# Patient Record
Sex: Male | Born: 1985 | Race: White | Hispanic: No | Marital: Single | Smoking: Never smoker
Health system: Southern US, Community
[De-identification: ages and names within clinical notes are randomized; demographics above are authoritative.]

## PROBLEM LIST (undated history)

## (undated) DIAGNOSIS — Z973 Presence of spectacles and contact lenses: Secondary | ICD-10-CM

## (undated) DIAGNOSIS — M069 Rheumatoid arthritis, unspecified: Secondary | ICD-10-CM

## (undated) DIAGNOSIS — F419 Anxiety disorder, unspecified: Secondary | ICD-10-CM

## (undated) HISTORY — PX: RHINOPLASTY: SUR1284

## (undated) HISTORY — DX: Presence of spectacles and contact lenses: Z97.3

---

## 2009-12-30 ENCOUNTER — Emergency Department (HOSPITAL_COMMUNITY): Admission: EM | Admit: 2009-12-30 | Discharge: 2009-12-30 | Payer: Self-pay | Admitting: Emergency Medicine

## 2011-07-16 ENCOUNTER — Ambulatory Visit (HOSPITAL_COMMUNITY): Admission: RE | Admit: 2011-07-16 | Payer: No Typology Code available for payment source | Source: Ambulatory Visit

## 2011-07-16 ENCOUNTER — Encounter (HOSPITAL_COMMUNITY): Payer: Self-pay | Admitting: *Deleted

## 2011-07-16 ENCOUNTER — Emergency Department (HOSPITAL_COMMUNITY)
Admission: EM | Admit: 2011-07-16 | Discharge: 2011-07-16 | Disposition: A | Discharge status: Home / Self Care | Payer: Self-pay | Attending: Emergency Medicine | Admitting: Emergency Medicine

## 2011-07-16 DIAGNOSIS — M79609 Pain in unspecified limb: Secondary | ICD-10-CM | POA: Insufficient documentation

## 2011-07-16 DIAGNOSIS — IMO0002 Reserved for concepts with insufficient information to code with codable children: Secondary | ICD-10-CM | POA: Insufficient documentation

## 2011-07-16 DIAGNOSIS — F29 Unspecified psychosis not due to a substance or known physiological condition: Secondary | ICD-10-CM | POA: Insufficient documentation

## 2011-07-16 NOTE — ED Notes (Signed)
EDP at bedside  
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
## 2011-07-16 NOTE — ED Notes (Signed)
Pt states that he does not want x-rays. MD notified
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
## 2012-08-09 ENCOUNTER — Ambulatory Visit: Payer: Self-pay | Admitting: Medical

## 2012-08-10 ENCOUNTER — Encounter: Payer: Self-pay | Admitting: Medical

## 2012-08-10 ENCOUNTER — Ambulatory Visit (INDEPENDENT_AMBULATORY_CARE_PROVIDER_SITE_OTHER): Payer: No Typology Code available for payment source | Admitting: Medical

## 2012-08-10 VITALS — BP 110/78 | HR 88 | Temp 98.3°F | Resp 16 | Wt 167.0 lb

## 2012-08-10 DIAGNOSIS — M25551 Pain in right hip: Secondary | ICD-10-CM

## 2012-08-10 DIAGNOSIS — M25519 Pain in unspecified shoulder: Secondary | ICD-10-CM

## 2012-08-10 DIAGNOSIS — M25569 Pain in unspecified knee: Secondary | ICD-10-CM

## 2012-08-10 DIAGNOSIS — M25559 Pain in unspecified hip: Secondary | ICD-10-CM

## 2012-08-10 DIAGNOSIS — M25512 Pain in left shoulder: Secondary | ICD-10-CM

## 2012-08-10 DIAGNOSIS — M25562 Pain in left knee: Secondary | ICD-10-CM

## 2012-08-10 MED ORDER — DICLOFENAC SODIUM 75 MG PO TBEC: 75.0000 mg | DELAYED_RELEASE_TABLET | Freq: Two times a day (BID) | ORAL | Status: DC

## 2012-08-10 MED ORDER — DICLOFENAC SODIUM 75 MG PO TBEC: 75.0000 mg | DELAYED_RELEASE_TABLET | Freq: Two times a day (BID) | ORAL | Status: AC

## 2012-08-10 NOTE — Progress Notes (Signed)
Subjective: New patient today.    Here for c/o left knee.   Has hx/o torn meniscus for 2.5 years.   Saw ED in the past for knee pain, they advised torn meniscus.  Has had pains and problems with the left knee since.  He has continued to do jujitsu and other activity, but   Recently 47mo was stretching his leg, felt an immediate pop, and this felt.  Gets crunching and cracking in both knees.  Has ongoing "constant knee pain", the knee feels unstable.  If he stands too long on the knee, gets bad aching, and every step feels like hammer tapping on the knee cap.  Hasn't been able to work the past 5 days due to the knee pain and has been getting right hip and right knee soreness as well.   Denies current joint swelling.  Denies any recent fall or trauma.    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  Lately unable to do his job due to the pain.    He was in [employer] for year in the past, was advised then that he had arthritis in the left knee.     Past Medical History  Diagnosis Date  . Wears contact lenses    Family History  Problem Relation Age of Onset  . Rheum arthritis Mother   . Arthritis Mother   . Arthritis Father     ROS as in subjective  Objective: Gen: wd, wn, nad Skin: no erythema or ecchymosis MSK: left knee seems to have laxity with anterior drawer compared to right, there is some pain with McMurray's test, somewhat of a click, pain with rotational motion of the left knee.  Right hip ROM normal, but external rotation and hip extension produces a click.  otherwise LE without deformity other laxity and nontender otherwise.  No swelling.  Left shoulder with mild pain with empty can test, abduction over 100 degrees, otherwise nontender, normal ROM Neuro: normal LE sensation, strength and DTRs Pulses: normal Ext: no clubbing, no cyanosis, no edema   Assessment: Encounter Diagnoses  Name Primary?  . Knee pain, left Yes  .  Hip joint pain, right   . Left shoulder pain    Plan: Knee pain - exam today suggests possible ACL tear vs meniscus issue.  Asymmetry compared to right, and long 2.5 year history of no improvement in the knee pain.  diclofenac prescribed, note for work given, advised rest and ice for now, referral to ortho  Hip pain, right - obvious click with rotational motion, pain, etiology unclear.  Refer to ortho  Left shoulder pain - likely some mild tendonitis.  NSAIDs and rest advised

## 2012-08-12 ENCOUNTER — Other Ambulatory Visit: Payer: Self-pay | Admitting: Orthopedic Surgery

## 2012-08-12 DIAGNOSIS — M25562 Pain in left knee: Secondary | ICD-10-CM

## 2012-08-18 ENCOUNTER — Ambulatory Visit
Admission: RE | Admit: 2012-08-18 | Discharge: 2012-08-18 | Disposition: A | Discharge status: Home / Self Care | Payer: No Typology Code available for payment source | Source: Ambulatory Visit | Attending: Orthopedic Surgery | Admitting: Orthopedic Surgery

## 2012-08-18 DIAGNOSIS — M25562 Pain in left knee: Secondary | ICD-10-CM

## 2012-08-25 ENCOUNTER — Ambulatory Visit: Payer: No Typology Code available for payment source | Admitting: Physical Therapy

## 2012-08-29 ENCOUNTER — Ambulatory Visit: Payer: No Typology Code available for payment source | Attending: Orthopedic Surgery | Admitting: Physical Therapy

## 2015-05-01 ENCOUNTER — Encounter (HOSPITAL_COMMUNITY): Payer: Self-pay

## 2015-05-01 ENCOUNTER — Emergency Department (HOSPITAL_COMMUNITY): Payer: No Typology Code available for payment source

## 2015-05-01 ENCOUNTER — Emergency Department (HOSPITAL_COMMUNITY)
Admission: EM | Admit: 2015-05-01 | Discharge: 2015-05-01 | Disposition: A | Discharge status: Home / Self Care | Payer: Self-pay | Attending: Emergency Medicine | Admitting: Emergency Medicine

## 2015-05-01 DIAGNOSIS — Z973 Presence of spectacles and contact lenses: Secondary | ICD-10-CM | POA: Insufficient documentation

## 2015-05-01 DIAGNOSIS — Z791 Long term (current) use of non-steroidal anti-inflammatories (NSAID): Secondary | ICD-10-CM | POA: Insufficient documentation

## 2015-05-01 DIAGNOSIS — F419 Anxiety disorder, unspecified: Secondary | ICD-10-CM | POA: Insufficient documentation

## 2015-05-01 LAB — CBC WITH DIFFERENTIAL/PLATELET
BASOS PCT: 0 %
Basophils Absolute: 0 10*3/uL (ref 0.0–0.1)
Eosinophils Absolute: 0.1 10*3/uL (ref 0.0–0.7)
Eosinophils Relative: 1 %
HEMATOCRIT: 37.5 % — AB (ref 39.0–52.0)
HEMOGLOBIN: 12.9 g/dL — AB (ref 13.0–17.0)
Lymphocytes Relative: 30 %
Lymphs Abs: 1.5 10*3/uL (ref 0.7–4.0)
MCH: 31.6 pg (ref 26.0–34.0)
MCHC: 34.4 g/dL (ref 30.0–36.0)
MCV: 91.9 fL (ref 78.0–100.0)
MONOS PCT: 6 %
Monocytes Absolute: 0.3 10*3/uL (ref 0.1–1.0)
NEUTROS ABS: 3 10*3/uL (ref 1.7–7.7)
NEUTROS PCT: 63 %
Platelets: 166 10*3/uL (ref 150–400)
RBC: 4.08 MIL/uL — ABNORMAL LOW (ref 4.22–5.81)
RDW: 12.4 % (ref 11.5–15.5)
WBC: 4.9 10*3/uL (ref 4.0–10.5)

## 2015-05-01 LAB — BASIC METABOLIC PANEL
Anion gap: 10 (ref 5–15)
BUN: 13 mg/dL (ref 6–20)
CALCIUM: 9.2 mg/dL (ref 8.9–10.3)
CHLORIDE: 106 mmol/L (ref 101–111)
CO2: 25 mmol/L (ref 22–32)
CREATININE: 0.9 mg/dL (ref 0.61–1.24)
GFR calc non Af Amer: >60 mL/min (ref >60)
GLUCOSE: 101 mg/dL — AB (ref 65–99)
Potassium: 4.1 mmol/L (ref 3.5–5.1)
Sodium: 141 mmol/L (ref 135–145)

## 2015-05-01 LAB — I-STAT TROPONIN, ED: TROPONIN I, POC: 0 ng/mL (ref 0.00–0.08)

## 2015-05-01 MED ORDER — DIAZEPAM 5 MG PO TABS: 5.0000 mg | ORAL_TABLET | Freq: Two times a day (BID) | ORAL | Status: AC | PRN

## 2015-05-01 MED ORDER — DIPHENHYDRAMINE HCL 50 MG/ML IJ SOLN
25.0000 mg | Freq: Once | INTRAMUSCULAR | Status: AC
  Administered 2015-05-01: 25 mg via INTRAVENOUS
  Filled 2015-05-01: qty 1

## 2015-05-01 MED ORDER — DIAZEPAM 5 MG/ML IJ SOLN
5.0000 mg | Freq: Once | INTRAMUSCULAR | Status: AC
  Administered 2015-05-01: 5 mg via INTRAVENOUS
  Filled 2015-05-01: qty 2

## 2015-05-01 NOTE — Discharge Instructions (Signed)
You were seen in the emergency room today for anxiety and shortness of breath/chest pain. Your labs were normal. Your chest symptoms are likely due to your anxiety. We gave you some Valium and Benadryl in the ER which seemed to help your symptoms. As we discussed, I will give you a referral to [facility]. Please call them to establish care and for further management of your anxiety. In the meantime I will give you a small prescription for Valium to take twice a day as needed. Please also follow up with your primary care provider within one week. Return to the ER for new or worsening symptoms.    Generalized Anxiety Disorder Generalized anxiety disorder (GAD) is a mental disorder. It interferes with life functions, including relationships, work, and school. GAD is different from normal anxiety, which everyone experiences at some point in their lives in response to specific life events and activities. Normal anxiety actually helps us prepare for and get through these life events and activities. Normal anxiety goes away after the event or activity is over.  GAD causes anxiety that is not necessarily related to specific events or activities. It also causes excess anxiety in proportion to specific events or activities. The anxiety associated with GAD is also difficult to control. GAD can vary from mild to severe. People with severe GAD can have intense waves of anxiety with physical symptoms (panic attacks).  SYMPTOMS The anxiety and worry associated with GAD are difficult to control. This anxiety and worry are related to many life events and activities and also occur more days than not for 6 months or longer. People with GAD also have three or more of the following symptoms (one or more in children):  Restlessness.   Fatigue.  Difficulty concentrating.   Irritability.  Muscle tension.  Difficulty sleeping or unsatisfying sleep. DIAGNOSIS GAD is diagnosed through an assessment by  your health care provider. Your health care provider will ask you questions aboutyour mood,physical symptoms, and events in your life. Your health care provider may ask you about your medical history and use of alcohol or drugs, including prescription medicines. Your health care provider may also do a physical exam and blood tests. Certain medical conditions and the use of certain substances can cause symptoms similar to those associated with GAD. Your health care provider may refer you to a mental health specialist for further evaluation. TREATMENT The following therapies are usually used to treat GAD:   Medication. Antidepressant medication usually is prescribed for long-term daily control. Antianxiety medicines may be added in severe cases, especially when panic attacks occur.   Talk therapy (psychotherapy). Certain types of talk therapy can be helpful in treating GAD by providing support, education, and guidance. A form of talk therapy called cognitive behavioral therapy can teach you healthy ways to think about and react to daily life events and activities.  Stress managementtechniques. These include yoga, meditation, and exercise and can be very helpful when they are practiced regularly. A mental health specialist can help determine which treatment is best for you. Some people see improvement with one therapy. However, other people require a combination of therapies.   This information is not intended to replace advice given to you by your health care provider. Make sure you discuss any questions you have with your health care provider.   Document Released: 08/22/2012 Document Revised: 05/18/2014 Document Reviewed: 08/22/2012 Elsevier Interactive Patient Education [employer]2016 [employer].
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
## 2015-05-01 NOTE — ED Notes (Signed)
Pt. Coming from home c/o 5 days of severe anxiety. He reports he has not been able to sleep or catch his breath since this has come on. Pt. Reports financial problems lately. Hx of self medicating with marijuana to help anxiety and stopped 1 month ago due to not being able to afford it. Pt. Denies any other medications for anxiety. Pt. Tearful at this time.

## 2015-05-03 ENCOUNTER — Encounter (HOSPITAL_COMMUNITY): Payer: Self-pay | Admitting: *Deleted

## 2015-05-03 ENCOUNTER — Emergency Department (HOSPITAL_COMMUNITY)
Admission: EM | Admit: 2015-05-03 | Discharge: 2015-05-03 | Disposition: A | Discharge status: Home / Self Care | Payer: No Typology Code available for payment source | Attending: Emergency Medicine | Admitting: Emergency Medicine

## 2015-05-03 DIAGNOSIS — Z791 Long term (current) use of non-steroidal anti-inflammatories (NSAID): Secondary | ICD-10-CM | POA: Insufficient documentation

## 2015-05-03 DIAGNOSIS — F419 Anxiety disorder, unspecified: Secondary | ICD-10-CM | POA: Insufficient documentation
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
## 2015-05-03 NOTE — Discharge Instructions (Signed)
°Emergency Department Resource Guide °1) Find a Doctor and Pay Out of Pocket °Although you won't have to find out who is covered by your insurance plan, it is a good idea to ask around and get recommendations. You will then need to call the office and see if the doctor you have chosen will accept you as a new patient and what types of options they offer for patients who are self-pay. Some doctors offer discounts or will set up payment plans for their patients who do not have insurance, but you will need to ask so you aren't surprised when you get to your appointment. ° °2) Contact Your Local Health Department °Not all health departments have doctors that can see patients for sick visits, but many do, so it is worth a call to see if yours does. If you don't know where your local health department is, you can check in your phone book. The CDC also has a tool to help you locate your state's health department, and many state websites also have listings of all of their local health departments. ° °3) Find a Walk-in Clinic °If your illness is not likely to be very severe or complicated, you may want to try a walk in clinic. These are popping up all over the country in pharmacies, drugstores, and shopping centers. They're usually staffed by nurse practitioners or physician assistants that have been trained to treat common illnesses and complaints. They're usually fairly quick and inexpensive. However, if you have serious medical issues or chronic medical problems, these are probably not your best option. ° °No Primary Care Doctor: °- Call [facility] at  832-8000 - they can help you locate a primary care doctor that  accepts your insurance, provides certain services, etc. °- Physician Referral Service- 1-800-533-3463 ° °Chronic Pain Problems: °Organization         Address  Phone   Notes  °[facility]  (336) 297-2271 Patients need to be referred by their primary care doctor.  ° °Medication  Assistance: °Organization         Address  Phone   Notes  °[place] [facility] [address] °[place], [place] [address] (336) 641-8030 --Must be a resident of [place] °-- Must have NO insurance coverage whatsoever (no Medicaid/ Medicare, etc.) °-- The pt. MUST have a primary care doctor that directs their care regularly and follows them in the community °  °[facility]  (866) 331-1348   °[employer]  (888) 892-1162   ° °Agencies that provide inexpensive medical care: °Organization         Address  Phone   Notes  °[facility]  (336) 832-8035   °[facility]    (336) 832-7272   °[facility] [address] °[place], [place] [address] (336) 832-4777   °[facility] [address], °[place] (336) 271-4999   °[facility]    (336) 373-0678   °[facility]    (336) 272-1050   °[facility] ° [address], [place] Phone:  (336) 832-4444, Fax:  (336) 832-4440 Hours of Operation:  9 am - 6 pm, M-F.  Also accepts Medicaid/Medicare and self-pay.  °[facility] ° [address], [place] Phone: (336) 832-3150, Fax: (336) 832-3151. Hours of Operation:  8:30 am - 5:30 pm, M-F.  Also accepts Medicaid and self-pay.  °[facility] [place] [address], [place] Phone: (336) 878-6027   °[facility] [address], [place], [place] (336)723-1848, Ext. 123 Mondays & Thursdays: 7-9 AM.  First 15 patients are seen on a first come, first serve basis. °  ° °Medicaid-accepting [place] Providers: ° °Organization         Address  Phone   Notes  °[facility] [address], [place] (336) 641-2100 Also accepts self-pay patients.  °[facility] [address], [place] ° (336) 856-9996   °[facility] [address], [place]  (336) 288-8857   °[facility] [address] [place] [address], [place] (336) 299-7000   °Veita Bland [address], [place]  ° (336) 373-1557 Only accepts  Access Medicaid patients after they have their name applied to their card.  ° °Self-Pay (no insurance) in [place]: ° °Organization         Address  Phone   Notes  °Sickle Cell Patients, [facility] [address], [place] (336) 832-1970   °[facility] [address], [place] (336) 832-4400   °[facility] [place] ° [address], [place]  (336) 992-4800   °[facility]/Dr. Osei-Bonsu ° [address] [place] [address], [place] or [address], [place] (336) 841-8500 Phone number for both [place] and [place] locations is the same.  °[facility] [address], [place] (336) 299-0000   °[facility] [address] [place] [address], [place] or [address] (336) 852-7530 °(336) 878-2260   °[facility] [address], [place] (336) 350-1642, phone; (336) 294-5005, fax Sees patients 1st and 3rd Saturday of every month.  Must not qualify for public or private insurance (i.e. Medicaid, Medicare, [place] Health Choice, Veterans' Benefits) • Household income should be no more than 200% of the poverty level •The clinic cannot treat you if you are pregnant or think you are pregnant • Sexually transmitted diseases are not treated at the clinic.  ° ° °Dental Care: °Organization         Address  Phone  Notes  °[place] [facility] [address],  (336) 641-6152 Accepts children up to age 21 who are enrolled in Medicaid or [place] Health Choice; pregnant women with a Medicaid card; and children who have applied for Medicaid or [place] Health Choice, but were declined, whose parents can pay a reduced fee at time of service.  °[place]  [facility] [place]  [address], [place] (336) 641-7733 Accepts children up to age 21 who are enrolled in Medicaid or [place] Health Choice; pregnant women with a Medicaid card; and children who have applied for Medicaid or [place] Health Choice, but were declined, whose parents can pay a reduced fee at time of service.  °[facility] ° [address],  (336) 641-4533 Patients are seen by appointment only. Walk-ins are not accepted. [facility] will see patients 18 years of age and older. °Monday - Tuesday (8am-5pm) °Most Wednesdays (8:30-5pm) °$30 per visit, cash only  °[facility] ° [address], [place] (336) 641-4533 Patients are seen by appointment only. Walk-ins are not accepted. [facility] will see patients 18 years of age and older. °One   Wednesday Evening (Monthly: Volunteer Based).  $30 per visit, cash only  °[facility]  (919) 537-3737 for adults; Children under age 4, call Graduate Pediatric Dentistry at (919) [phone]. Children aged 4-14, please call (919) 537-3737 to request a pediatric application. ° Dental services are provided in all areas of dental care including fillings, crowns and bridges, complete and partial dentures, implants, gum treatment, root canals, and extractions. Preventive care is also provided. Treatment is provided to both adults and children. °Patients are selected via a lottery and there is often a waiting list. °  °[facility] [address], °[place] ° (336) 763-8833 [email or web address] °  °[facility] [address], [place], [place] (336)723-1848, Ext. 123 Second and Fourth Thursday of each month, opens at 6:30 AM; Clinic ends at 9 AM.  Patients are seen on a first-come first-served basis, and a limited number are seen during each clinic.  ° °[facility] ° [address], [place], [place] (336) 723-7904    Eligibility Requirements °You must have lived in [place], [place], or [place] counties for at least the last three months. °  You cannot be eligible for state or federal sponsored healthcare insurance, including Veterans Administration, Medicaid, or Medicare. °  You generally cannot be eligible for healthcare insurance through your employer.  °  How to apply: °Eligibility screenings are held every Tuesday and Wednesday afternoon from 1:00 pm until 4:00 pm. You do not need an appointment for the interview!  °[facility] [address], [place], [place] 336-631-2330   °[facility]  336-342-8273   °[facility]  336-703-3100   °[facility]  336-570-6415   ° °Behavioral Health Resources in the Community: °Intensive Outpatient Programs °Organization         Address  Phone  Notes  °[facility] [address], [place], [place] 336-878-6098   °[facility] [address], [place], [place] 336-832-9800   °[facility] [address], [place], [place] ° 336-882-2125   °[facility] [address],  °[place], [place] 1-800-853-5163 or 336-641-4981   °Substance Abuse Resources °Organization         Address  Phone  Notes  °[facility]  336-882-2125   °[facility]  336-784-9470   °[facility]  336-285-9073   °[facility]  336-845-3988   °Residential & Outpatient Substance Abuse Program  1-800-659-3381   °Psychological Services °Organization         Address  Phone  Notes  °[facility]  336- 832-9600   °[facility]  336- 378-7881   °[facility] [address], [place] 1-800-853-5163 or 336-641-4981   ° °Mobile Crisis Teams °Organization         Address  Phone  Notes  °[facility], Mobile Crisis Care Unit  1-877-626-1772   °Assertive °[facility] ° [address]  [place], [place] 336-834-9664   °Sharon DeEsch [address] ° [place] 336-554-5454   ° °Self-Help/Support Groups °Organization         Address  Phone             Notes  °[facility]  - variety of support groups  336- 373-1402 Call for more information  °Narcotics Anonymous (NA), [facility] [address], °[place]   2 meetings at this location  ° °  Residential Treatment Programs °Organization         Address  Phone  Notes  °[facility] [address],    °[place]  1-866-801-8205   °[facility] ° [address], [place], [place] 704-293-8524   °[facility] [address], [place] 336-845-3988 Admissions: 8am-3pm M-F  °[facility] [address]    °[place], [place] 336-841-1104   °[facility] [address], [place], [place] 336-379-7146   °[facility] [address]  °[place], [place] 336-285-9073   °[facility] Intensive Outpatient [address], [place], [place] 336-852-3033   °[facility] [address]  °[place], [place] 1-877-615-2722 or 336-784-9470   °[facility] [address] [place], [place] 336-227-7417 Accepts Medicaid  °[facility] [address]  °[place] 1-800-659-3381 Substance Abuse/Addiction Treatment  ° °[place] Behavioral Health Resources °Organization         Address  Phone  Notes  °[facility]  (888) 581-9988   °Julie Brannon, PhD [address] [place], [place]   (336) 349-5553 or (336) 951-0000   °[facility]   [address] °[place], [place] (336) 349-4454   °[facility] [address], [place], [place] (336) 342-8316 Insurance/Medicaid/sponsorship through [facility]  °[facility] [address]                                    [place], [place] (336) 342-8316 Therapy/tele-psych/case    °[facility] [address]  ° [place], [place] (336) 349-2233    °Dr. Arfeen  (336) 349-4544   °[facility] [place]  [employer] [place] [facility] 1) [address], [place] °2) [address], [place] °3)  [address], [place] (336) 349-3220 °(336) 342-7768 ° °(336) 342-8140   °[place] [facility] (336) 342-1394 or (336) 342-3537 (After Hours)    ° ° °
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
## 2015-05-03 NOTE — ED Notes (Addendum)
Pt presents via POV c/o anxiety.  Pt reports being seen 2 days ago, prescribed diazepam, reports no relief.  Pt a x 4, denues SI/HI at this time. Reports trouble sleeping.    Reports triggers: lost his job before thanksgiving, got kicked out of his gma house, no relationship with family now.  Family hx of anxiety and depression.  Tearful when speaking with RN.

## 2019-02-25 ENCOUNTER — Encounter (HOSPITAL_COMMUNITY): Payer: Self-pay

## 2019-02-25 ENCOUNTER — Emergency Department (HOSPITAL_COMMUNITY)
Admission: EM | Admit: 2019-02-25 | Discharge: 2019-02-25 | Disposition: A | Discharge status: Home / Self Care | Payer: 59 | Attending: Emergency Medicine | Admitting: Emergency Medicine

## 2019-02-25 ENCOUNTER — Other Ambulatory Visit: Payer: Self-pay

## 2019-02-25 DIAGNOSIS — F419 Anxiety disorder, unspecified: Secondary | ICD-10-CM

## 2019-02-25 DIAGNOSIS — Z79899 Other long term (current) drug therapy: Secondary | ICD-10-CM | POA: Diagnosis not present

## 2019-02-25 HISTORY — DX: Rheumatoid arthritis, unspecified: M06.9

## 2019-02-25 HISTORY — DX: Anxiety disorder, unspecified: F41.9
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
## 2019-02-25 NOTE — ED Triage Notes (Signed)
Patient is here with reports of anxiety  He says he was at [facility] and was told to come to the ED for xanax.

## 2019-02-25 NOTE — Discharge Instructions (Signed)
Continue your home medications as previously prescribed. Is important for you to follow-up with your psychiatrist. Return to the ED if you start to develop chest pain, shortness of breath, leg swelling, coughing or vomiting up blood.

## 2020-05-29 ENCOUNTER — Ambulatory Visit (HOSPITAL_COMMUNITY)
Admission: EM | Admit: 2020-05-29 | Discharge: 2020-05-29 | Disposition: A | Discharge status: Home / Self Care | Payer: Worker's Compensation

## 2020-05-29 ENCOUNTER — Other Ambulatory Visit: Payer: Self-pay

## 2020-05-29 ENCOUNTER — Encounter (HOSPITAL_COMMUNITY): Payer: Self-pay | Admitting: Emergency Medicine

## 2020-05-29 DIAGNOSIS — R6884 Jaw pain: Secondary | ICD-10-CM

## 2020-05-29 DIAGNOSIS — M25511 Pain in right shoulder: Secondary | ICD-10-CM
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
## 2020-05-30 ENCOUNTER — Other Ambulatory Visit: Payer: Self-pay | Admitting: Family Medicine

## 2020-05-30 ENCOUNTER — Ambulatory Visit: Payer: Self-pay

## 2020-05-30 DIAGNOSIS — S0083XA Contusion of other part of head, initial encounter: Secondary | ICD-10-CM

## 2020-06-13 ENCOUNTER — Other Ambulatory Visit: Payer: Self-pay

## 2020-06-13 ENCOUNTER — Ambulatory Visit: Payer: Self-pay

## 2020-06-13 ENCOUNTER — Other Ambulatory Visit: Payer: Self-pay | Admitting: Family Medicine

## 2020-06-13 DIAGNOSIS — M25511 Pain in right shoulder: Secondary | ICD-10-CM

## 2022-07-03 ENCOUNTER — Other Ambulatory Visit: Payer: Self-pay

## 2022-07-03 ENCOUNTER — Emergency Department (HOSPITAL_COMMUNITY): Payer: No Typology Code available for payment source

## 2022-07-03 ENCOUNTER — Emergency Department (HOSPITAL_COMMUNITY)
Admission: EM | Admit: 2022-07-03 | Discharge: 2022-07-04 | Disposition: A | Discharge status: Home / Self Care | Payer: No Typology Code available for payment source | Attending: Emergency Medicine | Admitting: Emergency Medicine

## 2022-07-03 ENCOUNTER — Encounter (HOSPITAL_COMMUNITY): Payer: Self-pay

## 2022-07-03 DIAGNOSIS — K2900 Acute gastritis without bleeding: Secondary | ICD-10-CM

## 2022-07-03 DIAGNOSIS — R7309 Other abnormal glucose: Secondary | ICD-10-CM | POA: Diagnosis not present

## 2022-07-03 DIAGNOSIS — R112 Nausea with vomiting, unspecified: Secondary | ICD-10-CM | POA: Diagnosis present

## 2022-07-03 DIAGNOSIS — F1290 Cannabis use, unspecified, uncomplicated: Secondary | ICD-10-CM | POA: Diagnosis not present

## 2022-07-03 DIAGNOSIS — R1084 Generalized abdominal pain: Secondary | ICD-10-CM

## 2022-07-03 LAB — COMPREHENSIVE METABOLIC PANEL
ALT: 38 U/L (ref 0–44)
AST: 24 U/L (ref 15–41)
Albumin: 4.5 g/dL (ref 3.5–5.0)
Alkaline Phosphatase: 47 U/L (ref 38–126)
Anion gap: 9 (ref 5–15)
BUN: 12 mg/dL (ref 6–20)
CO2: 26 mmol/L (ref 22–32)
Calcium: 9.5 mg/dL (ref 8.9–10.3)
Chloride: 102 mmol/L (ref 98–111)
Creatinine, Ser: 0.93 mg/dL (ref 0.61–1.24)
GFR, Estimated: >60 mL/min (ref >60)
Glucose, Bld: 120 mg/dL — ABNORMAL HIGH (ref 70–99)
Potassium: 4 mmol/L (ref 3.5–5.1)
Sodium: 137 mmol/L (ref 135–145)
Total Bilirubin: 0.9 mg/dL (ref 0.3–1.2)
Total Protein: 8.2 g/dL — ABNORMAL HIGH (ref 6.5–8.1)

## 2022-07-03 LAB — URINALYSIS, ROUTINE W REFLEX MICROSCOPIC
Bacteria, UA: NONE SEEN
Bilirubin Urine: NEGATIVE
Glucose, UA: NEGATIVE mg/dL
Ketones, ur: NEGATIVE mg/dL
Leukocytes,Ua: NEGATIVE
Nitrite: NEGATIVE
Protein, ur: NEGATIVE mg/dL
Specific Gravity, Urine: 1.006 (ref 1.005–1.030)
pH: 5 (ref 5.0–8.0)

## 2022-07-03 LAB — CBC WITH DIFFERENTIAL/PLATELET
Abs Immature Granulocytes: 0.02 10*3/uL (ref 0.00–0.07)
Basophils Absolute: 0 10*3/uL (ref 0.0–0.1)
Basophils Relative: 0 %
Eosinophils Absolute: 0 10*3/uL (ref 0.0–0.5)
Eosinophils Relative: 0 %
HCT: 47.8 % (ref 39.0–52.0)
Hemoglobin: 15.8 g/dL (ref 13.0–17.0)
Immature Granulocytes: 0 %
Lymphocytes Relative: 14 %
Lymphs Abs: 1.5 10*3/uL (ref 0.7–4.0)
MCH: 30.4 pg (ref 26.0–34.0)
MCHC: 33.1 g/dL (ref 30.0–36.0)
MCV: 91.9 fL (ref 80.0–100.0)
Monocytes Absolute: 0.7 10*3/uL (ref 0.1–1.0)
Monocytes Relative: 7 %
Neutro Abs: 8.1 10*3/uL — ABNORMAL HIGH (ref 1.7–7.7)
Neutrophils Relative %: 79 %
Platelets: 263 10*3/uL (ref 150–400)
RBC: 5.2 MIL/uL (ref 4.22–5.81)
RDW: 12.4 % (ref 11.5–15.5)
WBC: 10.3 10*3/uL (ref 4.0–10.5)
nRBC: 0 % (ref 0.0–0.2)

## 2022-07-03 LAB — RAPID URINE DRUG SCREEN, HOSP PERFORMED
Amphetamines: NOT DETECTED
Barbiturates: NOT DETECTED
Benzodiazepines: NOT DETECTED
Cocaine: NOT DETECTED
Opiates: NOT DETECTED
Tetrahydrocannabinol: POSITIVE — AB

## 2022-07-03 LAB — LIPASE, BLOOD: Lipase: 31 U/L (ref 11–51)

## 2022-07-03 LAB — CBG MONITORING, ED: Glucose-Capillary: 123 mg/dL — ABNORMAL HIGH (ref 70–99)

## 2022-07-03 MED ORDER — ONDANSETRON 4 MG PO TBDP
4.0000 mg | ORAL_TABLET | Freq: Once | ORAL | Status: AC
  Administered 2022-07-03: 4 mg via ORAL
  Filled 2022-07-03: qty 1

## 2022-07-03 MED ORDER — IOHEXOL 300 MG/ML  SOLN
100.0000 mL | Freq: Once | INTRAMUSCULAR | Status: AC | PRN
  Administered 2022-07-03: 100 mL via INTRAVENOUS

## 2022-07-03 MED ORDER — DIPHENHYDRAMINE HCL 50 MG/ML IJ SOLN
25.0000 mg | Freq: Once | INTRAMUSCULAR | Status: AC
  Administered 2022-07-04: 25 mg via INTRAVENOUS
  Filled 2022-07-03: qty 1

## 2022-07-03 MED ORDER — PANTOPRAZOLE SODIUM 40 MG IV SOLR
40.0000 mg | Freq: Once | INTRAVENOUS | Status: AC
  Administered 2022-07-03: 40 mg via INTRAVENOUS
  Filled 2022-07-03: qty 10

## 2022-07-03 MED ORDER — METOCLOPRAMIDE HCL 5 MG/ML IJ SOLN
10.0000 mg | Freq: Once | INTRAMUSCULAR | Status: AC
  Administered 2022-07-04: 10 mg via INTRAVENOUS
  Filled 2022-07-03: qty 2

## 2022-07-03 MED ORDER — LACTATED RINGERS IV BOLUS: 1000.0000 mL | Freq: Once | INTRAVENOUS | Status: DC

## 2022-07-03 MED ORDER — LACTATED RINGERS IV BOLUS
1000.0000 mL | Freq: Once | INTRAVENOUS | Status: AC
  Administered 2022-07-03: 1000 mL via INTRAVENOUS

## 2022-07-03 MED ORDER — FAMOTIDINE IN NACL 20-0.9 MG/50ML-% IV SOLN
20.0000 mg | Freq: Once | INTRAVENOUS | Status: AC
  Administered 2022-07-04: 20 mg via INTRAVENOUS
  Filled 2022-07-03: qty 50

## 2022-07-03 NOTE — ED Triage Notes (Signed)
Pt with abdominal pain, nausea/vomiting x several days.  States began shortly after he stopped smoking marijuana after instructed to do so by the pain clinic.  He has been nauseated to the point he cannot take his prescribed pain medication and has had increased back and hip pain.  Pt reports feeling "hot" and "light headed" in the mornings and after hot baths.

## 2022-07-03 NOTE — ED Provider Notes (Signed)
[facility] Provider Note   CSN: [id number] Arrival date & time: 07/03/22  1926     History {Add pertinent medical, surgical, social history, OB history to HPI:1} Chief Complaint  Patient presents with   Abdominal Pain   Emesis    Kenneth Harrison is a 37 y.o. male.  HPI Kenneth Harrison , a 37 y.o. male.  Pt complains of nausea. States that he has been nauseated for 4 days. States today it got significantly worse and "painful." States he had vomiting a few days ago and then again today. States that he stopped smoking marijuana about 2 days prior to these symptoms starting.  Reports he was having some sweats but has not had a documented fever.  Reports he has been very nauseated he has had some episodes of vomiting.  He has been able to eat and drink certain foods this week.  He has not vomited up everything he has tried to eat.  Does report however he chronically takes hydrocodone 10\325 4 chronic back pain.  He reports usually will take it 5 days and then take a break from it.    Home Medications Prior to Admission medications   Medication Sig Start Date End Date Taking? Authorizing Provider  diazepam (VALIUM) 5 MG tablet Take 1 tablet (5 mg total) by mouth every 12 (twelve) hours as needed for anxiety. 05/01/15   Tomi Likens, PA-C  diclofenac (VOLTAREN) 75 MG EC tablet Take 1 tablet (75 mg total) by mouth 2 (two) times daily. Patient not taking: No sig reported 08/10/12   Tysinger, Camelia Eng, PA-C  folic acid (FOLVITE) 1 MG tablet Take 1 tablet by mouth daily. 10/21/18   [provider]  methotrexate (RHEUMATREX) 2.5 MG tablet 2.5 mg. 10/19/18   [provider]  OVER THE COUNTER MEDICATION Take 2 tablets by mouth 2 (two) times daily. 5htp    [provider]  predniSONE (DELTASONE) 5 MG tablet Take 2 tablets by mouth daily. 05/29/20   [provider]      Allergies    Patient has no known allergies.     Review of Systems   Review of Systems  Physical Exam Updated Vital Signs BP (!) 145/91 (BP Location: Left Arm)   Pulse 69   Temp 98.6 F (37 C) (Oral)   Resp 20   Ht '5\' 10"'$  (1.778 m)   Wt 93 kg   SpO2 98%   BMI 29.41 kg/m  Physical Exam Constitutional:      Appearance: He is well-developed.     Comments: Alert nontoxic.  GCS 15.  No acute distress but anxious in appearance.  No respiratory distress.  HENT:     Head: Normocephalic and atraumatic.     Mouth/Throat:     Pharynx: Oropharynx is clear.  Eyes:     Extraocular Movements: Extraocular movements intact.  Cardiovascular:     Rate and Rhythm: Normal rate and regular rhythm.  Pulmonary:     Effort: Pulmonary effort is normal.     Breath sounds: Normal breath sounds.  Abdominal:     Comments: Abdomen diffusely soft centrally.  Moderate discomfort to palpation.  No guarding.  No palpable mass.  Musculoskeletal:        General: No swelling. Normal range of motion.     Cervical back: Neck supple.     Right lower leg: No edema.     Left lower leg: No edema.  Skin:  General: Skin is warm and dry.  Neurological:     General: No focal deficit present.     Mental Status: He is alert and oriented to person, place, and time.     Motor: No weakness.     Coordination: Coordination normal.  Psychiatric:     Comments: Patient is anxious in appearance.     ED Results / Procedures / Treatments   Labs (all labs ordered are listed, but only abnormal results are displayed) Labs Reviewed  COMPREHENSIVE METABOLIC PANEL - Abnormal; Notable for the following components:      Result Value   Glucose, Bld 120 (*)    Total Protein 8.2 (*)    All other components within normal limits  CBC WITH DIFFERENTIAL/PLATELET - Abnormal; Notable for the following components:   Neutro Abs 8.1 (*)    All other components within normal limits  URINALYSIS, ROUTINE W REFLEX MICROSCOPIC - Abnormal; Notable for the following components:    Color, Urine STRAW (*)    Hgb urine dipstick SMALL (*)    All other components within normal limits  CBG MONITORING, ED - Abnormal; Notable for the following components:   Glucose-Capillary 123 (*)    All other components within normal limits  LIPASE, BLOOD  RAPID URINE DRUG SCREEN, HOSP PERFORMED    EKG None  Radiology No results found.  Procedures Procedures  {Document cardiac monitor, telemetry assessment procedure when appropriate:1}  Medications Ordered in ED Medications  ondansetron (ZOFRAN-ODT) disintegrating tablet 4 mg (4 mg Oral Given 07/03/22 2056)  lactated ringers bolus 1,000 mL (1,000 mLs Intravenous New Bag/Given 07/03/22 2147)  pantoprazole (PROTONIX) injection 40 mg (40 mg Intravenous Given 07/03/22 2144)    ED Course/ Medical Decision Making/ A&P   {   Click here for ABCD2, HEART and other calculatorsREFRESH Note before signing :1}                          Medical Decision Making Amount and/or Complexity of Data Reviewed Labs: ordered. Radiology: ordered.  Risk Prescription drug management.   Patient has several days of nausea and central abdominal pain.  Differential diagnosis includes appendicitis\pneumonia\gastritis\pancreatitis\cannabinoid hyperemesis and abdominal pain. Proceed with broad diagnostic evaluation including lab work, CT scan.  Patient treated with fluid resuscitation lactated Ringer's, Zofran and Protonix.  {Document critical care time when appropriate:1} {Document review of labs and clinical decision tools ie heart score, Chads2Vasc2 etc:1}  {Document your independent review of radiology images, and any outside records:1} {Document your discussion with family members, caretakers, and with consultants:1} {Document social determinants of health affecting pt's care:1} {Document your decision making why or why not admission, treatments were needed:1} Final Clinical Impression(s) / ED Diagnoses Final diagnoses:  Nausea and vomiting,  unspecified vomiting type  Generalized abdominal pain    Rx / DC Orders ED Discharge Orders     None

## 2022-07-03 NOTE — ED Provider Triage Note (Signed)
Emergency Medicine Provider Triage Evaluation Note  Kenneth Harrison , a 37 y.o. male  was evaluated in triage.  Pt complains of nausea. States that he has been nauseated for 4 days. States today it got significantly worse and "painful." States he had vomiting a few days ago and then again today. States that he stopped smoking marijuana about 2 days prior to these symptoms starting.   Review of Systems  Positive: See above Negative:   Physical Exam  BP (!) 145/91 (BP Location: Left Arm)   Pulse 69   Temp 98.6 F (37 C) (Oral)   Resp 20   Ht '5\' 10"'$  (1.778 m)   Wt 93 kg   SpO2 98%   BMI 29.41 kg/m  Gen:   Awake, no distress   Resp:  Normal effort  MSK:   Moves extremities without difficulty  Other:    Medical Decision Making  Medically screening exam initiated at 8:07 PM.  Appropriate orders placed.  Kenneth Harrison was informed that the remainder of the evaluation will be completed by another provider, this initial triage assessment does not replace that evaluation, and the importance of remaining in the ED until their evaluation is complete.     Kenneth Hillier, PA-C 07/03/22 2010

## 2022-07-04 MED ORDER — METOCLOPRAMIDE HCL 10 MG PO TABS: 10.0000 mg | ORAL_TABLET | Freq: Three times a day (TID) | ORAL | 0 refills | Status: AC | PRN

## 2022-07-04 MED ORDER — PANTOPRAZOLE SODIUM 20 MG PO TBEC: 20.0000 mg | DELAYED_RELEASE_TABLET | Freq: Every day | ORAL | 0 refills | Status: AC

## 2022-07-04 MED ORDER — FAMOTIDINE 20 MG PO TABS: 20.0000 mg | ORAL_TABLET | Freq: Two times a day (BID) | ORAL | 0 refills | Status: AC

## 2022-07-04 NOTE — ED Notes (Signed)
Pt would like to leave without completing the IV.

## 2022-07-04 NOTE — ED Notes (Signed)
PT requesting to be disconnected from everything because he "has been here for 4 hours and is feeling anxious."

## 2022-07-04 NOTE — Discharge Instructions (Addendum)
1.  Review information for gastritis.  Start taking Protonix daily and Pepcid twice daily as prescribed. 2.  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 3.  Follow-up at the [facility] as soon as possible with your gastroenterologist.  Have them review the CT scan done in the emergency department.  It shows inflammation of your stomach but also shows some enlargement of the pancreas.  No specific mass was identified but this needs to be evaluated to make sure there are no other concerning findings. 4.  Return to the emergency department if you have suddenly worsening pain, cannot tolerate fluids despite taking medications or other concerning changes.

## 2023-01-12 IMAGING — DX DG SHOULDER 2+V*R*
3 series · 3 of 3 positions shown · non-contrast
Comparison: No prior.

CLINICAL DATA: Right shoulder pain.

EXAM:
RIGHT SHOULDER - 2+ VIEW

[shoulder ap]
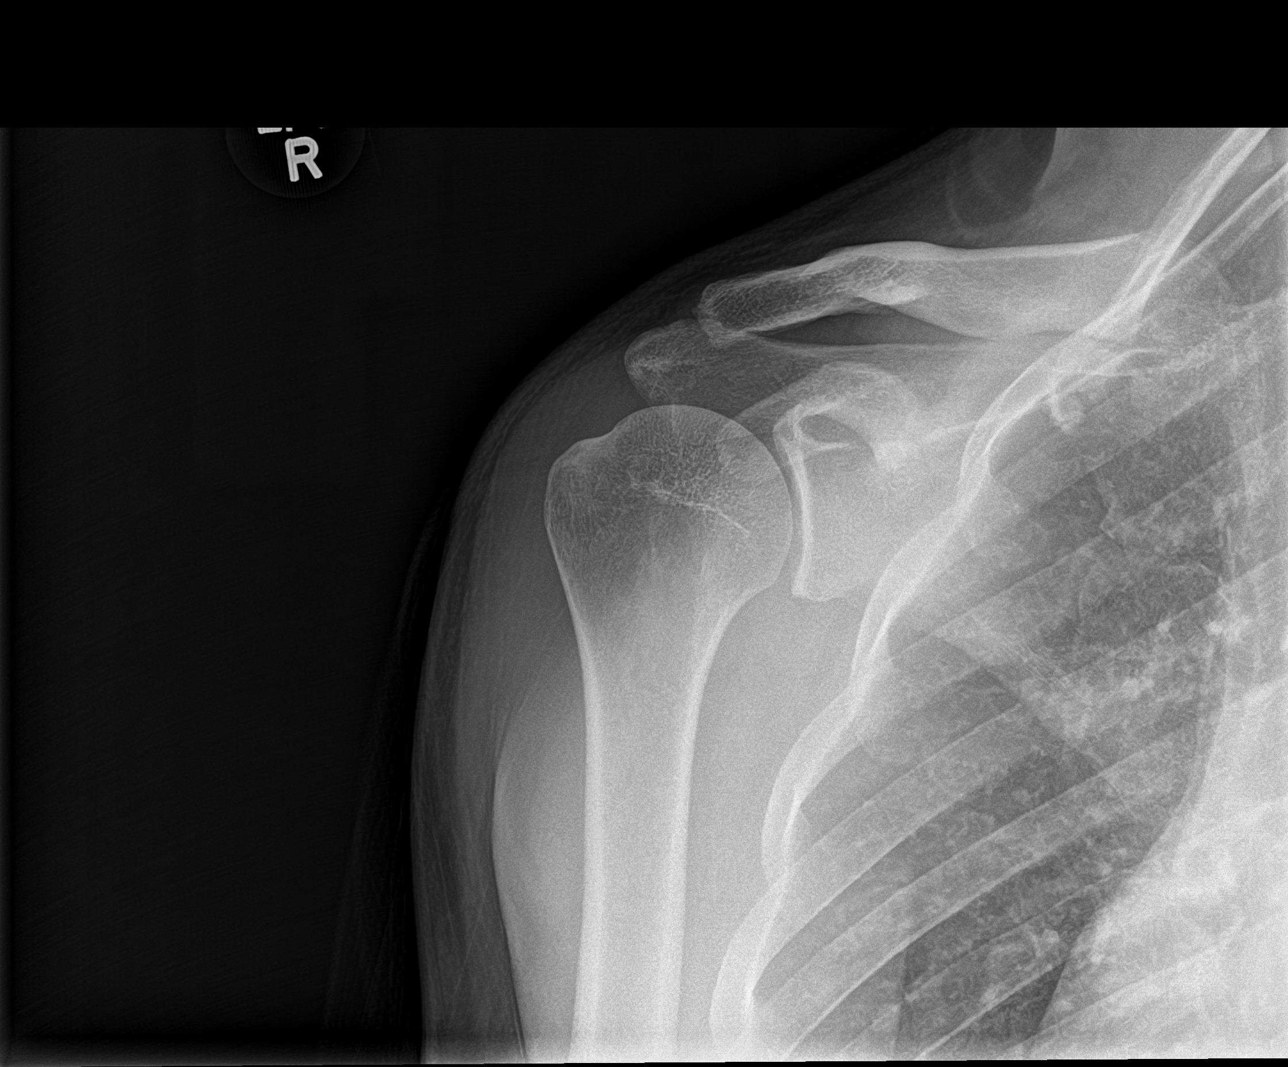

[shoulder y-view]
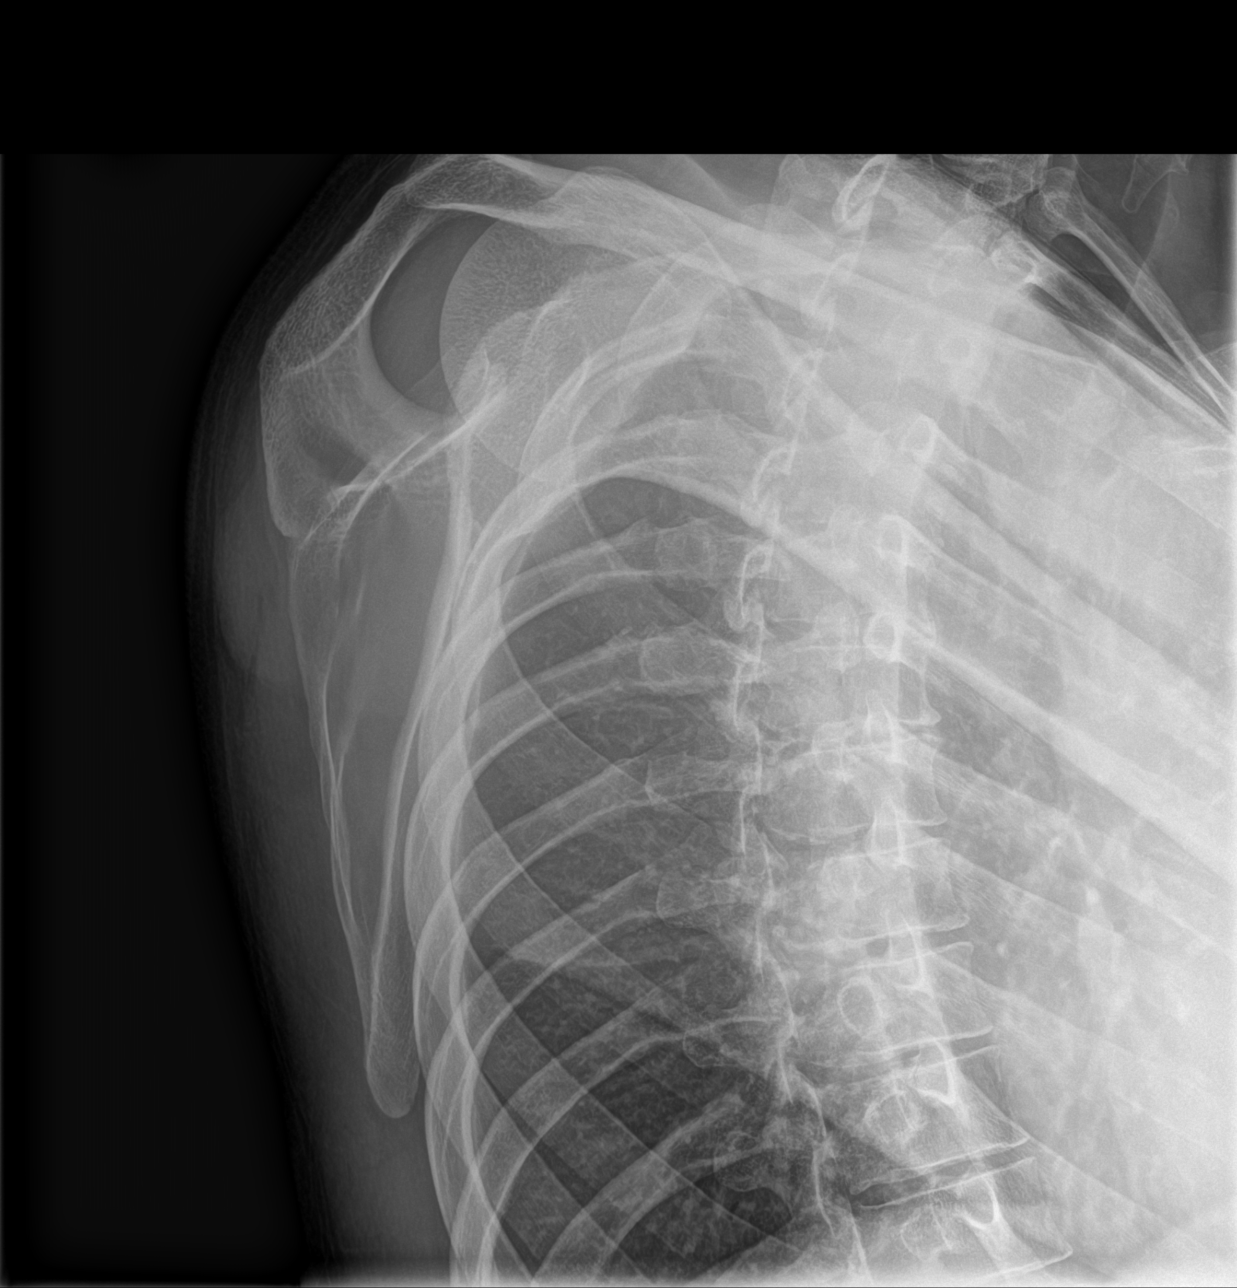

[shoulder axial]
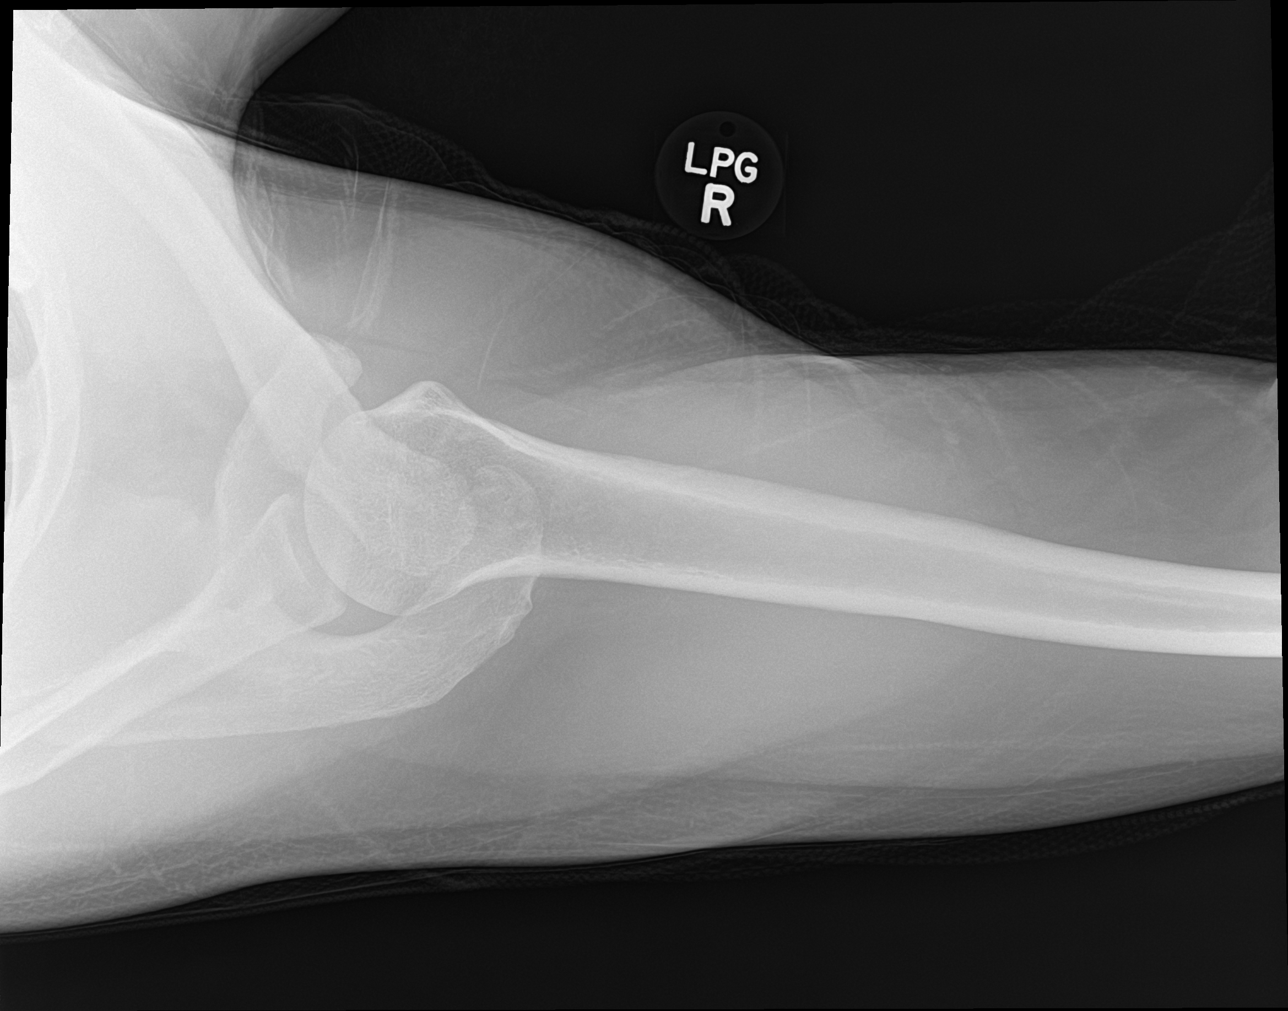

[3 of 3 positions shown; findings below may reference images not displayed]

FINDINGS: Acromioclavicular glenohumeral degenerative change. No acute bony
abnormality. No evidence of fracture or dislocation. No evidence of
separation.
IMPRESSION: Acromioclavicular and glenohumeral degenerative change. No acute
abnormality
